# Patient Record
Sex: Male | Born: 1980 | Race: White | Hispanic: No | Marital: Married | Smoking: Current some day smoker
Health system: Southern US, Community
[De-identification: ages and names within clinical notes are randomized; demographics above are authoritative.]

---

## 2013-10-18 ENCOUNTER — Encounter (HOSPITAL_BASED_OUTPATIENT_CLINIC_OR_DEPARTMENT_OTHER): Payer: Self-pay | Admitting: Emergency Medicine

## 2013-10-18 ENCOUNTER — Emergency Department (HOSPITAL_BASED_OUTPATIENT_CLINIC_OR_DEPARTMENT_OTHER)
Admission: EM | Admit: 2013-10-18 | Discharge: 2013-10-18 | Disposition: A | Discharge status: Home / Self Care | Payer: Worker's Compensation | Attending: Emergency Medicine | Admitting: Emergency Medicine

## 2013-10-18 ENCOUNTER — Emergency Department (HOSPITAL_BASED_OUTPATIENT_CLINIC_OR_DEPARTMENT_OTHER): Payer: Worker's Compensation

## 2013-10-18 DIAGNOSIS — S93602A Unspecified sprain of left foot, initial encounter: Secondary | ICD-10-CM

## 2013-10-18 DIAGNOSIS — S93609A Unspecified sprain of unspecified foot, initial encounter: Secondary | ICD-10-CM | POA: Insufficient documentation

## 2013-10-18 DIAGNOSIS — Y929 Unspecified place or not applicable: Secondary | ICD-10-CM | POA: Insufficient documentation

## 2013-10-18 DIAGNOSIS — F17200 Nicotine dependence, unspecified, uncomplicated: Secondary | ICD-10-CM | POA: Insufficient documentation

## 2013-10-18 DIAGNOSIS — R296 Repeated falls: Secondary | ICD-10-CM | POA: Insufficient documentation

## 2013-10-18 DIAGNOSIS — Y9389 Activity, other specified: Secondary | ICD-10-CM | POA: Insufficient documentation

## 2013-10-18 NOTE — ED Notes (Signed)
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

## 2013-10-18 NOTE — ED Notes (Signed)
Pt states at work his lt foot was wedged in a fabric roll and started to fall but foot stayed. C/o lt ankle pain with swelling

## 2013-10-18 NOTE — ED Notes (Signed)
MD at bedside. 

## 2013-10-18 NOTE — ED Provider Notes (Signed)
CSN: [id number]     Arrival date & time 10/18/13  0142 History   First MD Initiated Contact with Patient 10/18/13 0249     Chief Complaint  Patient presents with  . Ankle Pain     (Consider location/radiation/quality/duration/timing/severity/associated sxs/prior Treatment) HPI Comments: Patient is a 33 year old male otherwise healthy who presents with complaints of left foot and ankle pain. States a roll of fabric rolled onto his ankle and caused him to fall awkwardly. He has swelling to the top of the foot just below the ankle is having difficulty bearing weight.  Patient is a 33 y.o. male presenting with ankle pain. The history is provided by the patient.  Ankle Pain Location:  Foot and ankle Time since incident:  2 hours Injury: yes   Ankle location:  L ankle Foot location:  L foot Chronicity:  New Prior injury to area:  No Relieved by:  Nothing Worsened by:  Activity and bearing weight   History reviewed. No pertinent past medical history. History reviewed. No pertinent past surgical history. No family history on file. History  Substance Use Topics  . Smoking status: Current Some Day Smoker  . Smokeless tobacco: Not on file  . Alcohol Use: Yes     Comment: social    Review of Systems  All other systems reviewed and are negative.     Allergies  Review of patient's allergies indicates no known allergies.  Home Medications   Prior to Admission medications   Not on File   BP 113/87  Pulse 102  Temp(Src) 98.6 F (37 C) (Oral)  Resp 20  Ht 5\' 11"  (1.803 m)  Wt 170 lb (77.111 kg)  BMI 23.72 kg/m2  SpO2 100% Physical Exam  Nursing note and vitals reviewed. Constitutional: He is oriented to person, place, and time. He appears well-developed and well-nourished. No distress.  HENT:  Head: Normocephalic and atraumatic.  Neck: Normal range of motion. Neck supple.  Musculoskeletal: Normal range of motion.  The left ankle appears grossly normal. There is no  tenderness to palpation over the medial or lateral malleolus. There is tenderness to palpation over the lateral aspect of the dorsum of the foot along with swelling.  Neurological: He is alert and oriented to person, place, and time.  Skin: Skin is warm and dry. He is not diaphoretic.    ED Course  Procedures (including critical care time) Labs Review Labs Reviewed - No data to display  Imaging Review Dg Ankle Complete Left  10/18/2013   CLINICAL DATA:  Left ankle injury this morning.  Pain and swelling.  EXAM: LEFT ANKLE COMPLETE - 3+ VIEW  COMPARISON:  None.  FINDINGS: No evidence of acute fracture or dislocation of the left ankle. Ankle mortise and talar dome appear intact. Soft tissues are unremarkable. Benign-appearing sclerosis in the distal tibia posteriorly probably represents an old fibrous cortical defect.  IMPRESSION: No acute bony abnormalities.   Electronically Signed   By: Burman NievesWilliam  Stevens M.D.   On: 10/18/2013 02:33   Dg Foot Complete Left  10/18/2013   CLINICAL DATA:  Left foot injury at work with pain and swelling.  EXAM: LEFT FOOT - COMPLETE 3+ VIEW  COMPARISON:  None.  FINDINGS: There is no evidence of fracture or dislocation. There is no evidence of arthropathy or other focal bone abnormality. Soft tissues are unremarkable.  IMPRESSION: Negative.   Electronically Signed   By: Burman NievesWilliam  Stevens M.D.   On: 10/18/2013 02:33     EKG Interpretation None  MDM   Final diagnoses:  None    X-rays are negative. This appears to be a sprain of the left foot. This will be treated with an Ace bandage, rest, ice, elevation, and ibuprofen.    Geoffery Lyonsouglas Jeramie Scogin, MD 10/18/13 [phone]

## 2013-10-18 NOTE — Discharge Instructions (Signed)
Rest. Ice for 20 minutes every 2 hours while awake for the next 2 days. Wear Ace bandage. Elevate.  Ibuprofen 600 mg every 6 hours as needed for pain.  Followup with your primary Dr. if not improving in the next week.   Foot Sprain The muscles and cord like structures which attach muscle to bone (tendons) that surround the feet are made up of units. A foot sprain can occur at the weakest spot in any of these units. This condition is most often caused by injury to or overuse of the foot, as from playing contact sports, or aggravating a previous injury, or from poor conditioning, or obesity. SYMPTOMS  Pain with movement of the foot.  Tenderness and swelling at the injury site.  Loss of strength is present in moderate or severe sprains. THE THREE GRADES OR SEVERITY OF FOOT SPRAIN ARE:  Mild (Grade I): Slightly pulled muscle without tearing of muscle or tendon fibers or loss of strength.  Moderate (Grade II): Tearing of fibers in a muscle, tendon, or at the attachment to bone, with small decrease in strength.  Severe (Grade III): Rupture of the muscle-tendon-bone attachment, with separation of fibers. Severe sprain requires surgical repair. Often repeating (chronic) sprains are caused by overuse. Sudden (acute) sprains are caused by direct injury or over-use. DIAGNOSIS  Diagnosis of this condition is usually by your own observation. If problems continue, a caregiver may be required for further evaluation and treatment. X-rays may be required to make sure there are not breaks in the bones (fractures) present. Continued problems may require physical therapy for treatment. PREVENTION  Use strength and conditioning exercises appropriate for your sport.  Warm up properly prior to working out.  Use athletic shoes that are made for the sport you are participating in.  Allow adequate time for healing. Early return to activities makes repeat injury more likely, and can lead to an unstable  arthritic foot that can result in prolonged disability. Mild sprains generally heal in 3 to 10 days, with moderate and severe sprains taking 2 to 10 weeks. Your caregiver can help you determine the proper time required for healing. HOME CARE INSTRUCTIONS   Apply ice to the injury for 15-20 minutes, 03-04 times per day. Put the ice in a plastic bag and place a towel between the bag of ice and your skin.  An elastic wrap (like an Ace bandage) may be used to keep swelling down.  Keep foot above the level of the heart, or at least raised on a footstool, when swelling and pain are present.  Try to avoid use other than gentle range of motion while the foot is painful. Do not resume use until instructed by your caregiver. Then begin use gradually, not increasing use to the point of pain. If pain does develop, decrease use and continue the above measures, gradually increasing activities that do not cause discomfort, until you gradually achieve normal use.  Use crutches if and as instructed, and for the length of time instructed.  Keep injured foot and ankle wrapped between treatments.  Massage foot and ankle for comfort and to keep swelling down. Massage from the toes up towards the knee.  Only take over-the-counter or prescription medicines for pain, discomfort, or fever as directed by your caregiver. SEEK IMMEDIATE MEDICAL CARE IF:   Your pain and swelling increase, or pain is not controlled with medications.  You have loss of feeling in your foot or your foot turns cold or blue.  You develop  new, unexplained symptoms, or an increase of the symptoms that brought you to your caregiver. MAKE SURE YOU:   Understand these instructions.  Will watch your condition.  Will get help right away if you are not doing well or get worse. Document Released: 10/01/2001 Document Revised: 07/04/2011 Document Reviewed: 11/29/2007 [facility] Patient Information 2015 [place], [place]. This information is not  intended to replace advice given to you by your health care provider. Make sure you discuss any questions you have with your health care provider.

## 2015-07-25 IMAGING — CR DG ANKLE COMPLETE 3+V*L*
3 series · 3 of 3 positions shown · non-contrast
Comparison: None.

CLINICAL DATA: Left ankle injury this morning.  Pain and swelling.

EXAM:
LEFT ANKLE COMPLETE - 3+ VIEW

[t ankle joint ap left]
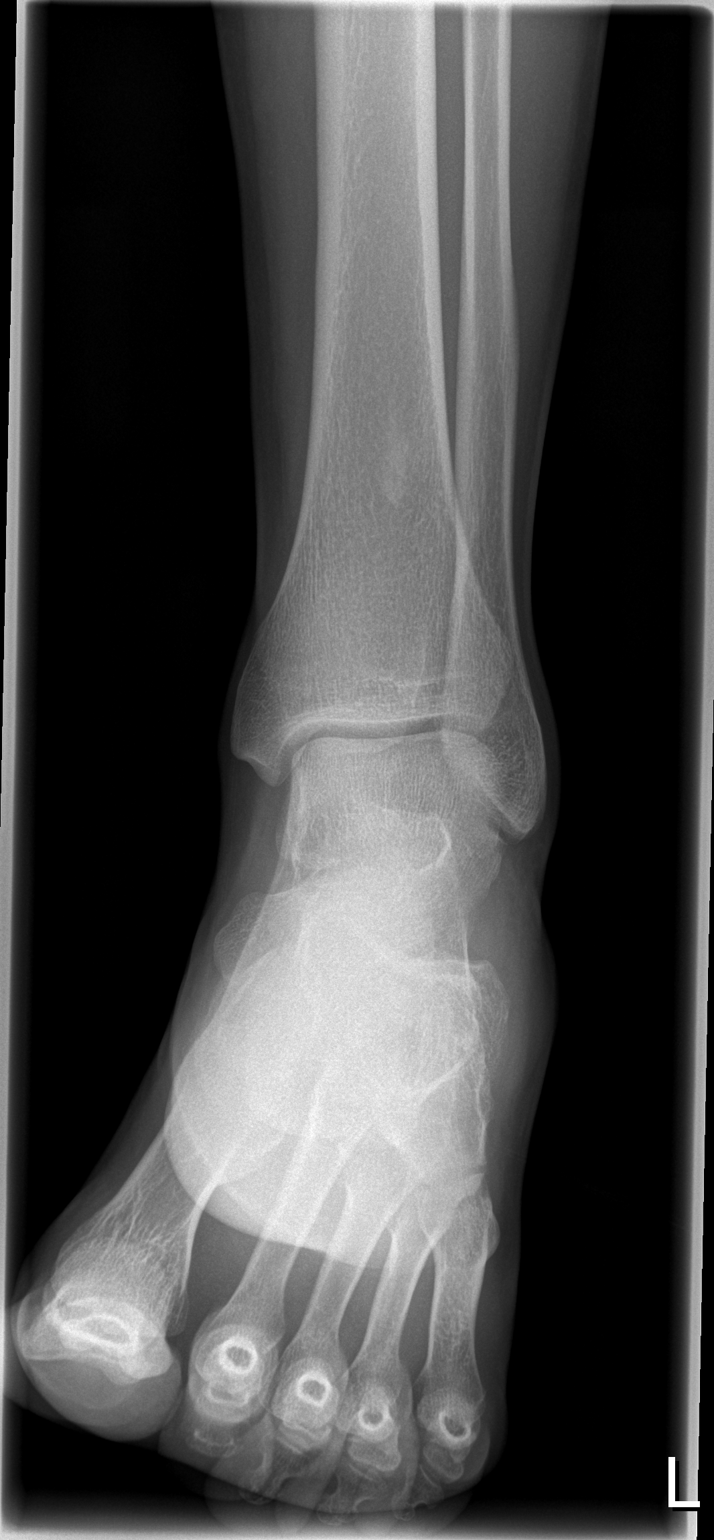

[t ankle joint oblique left]
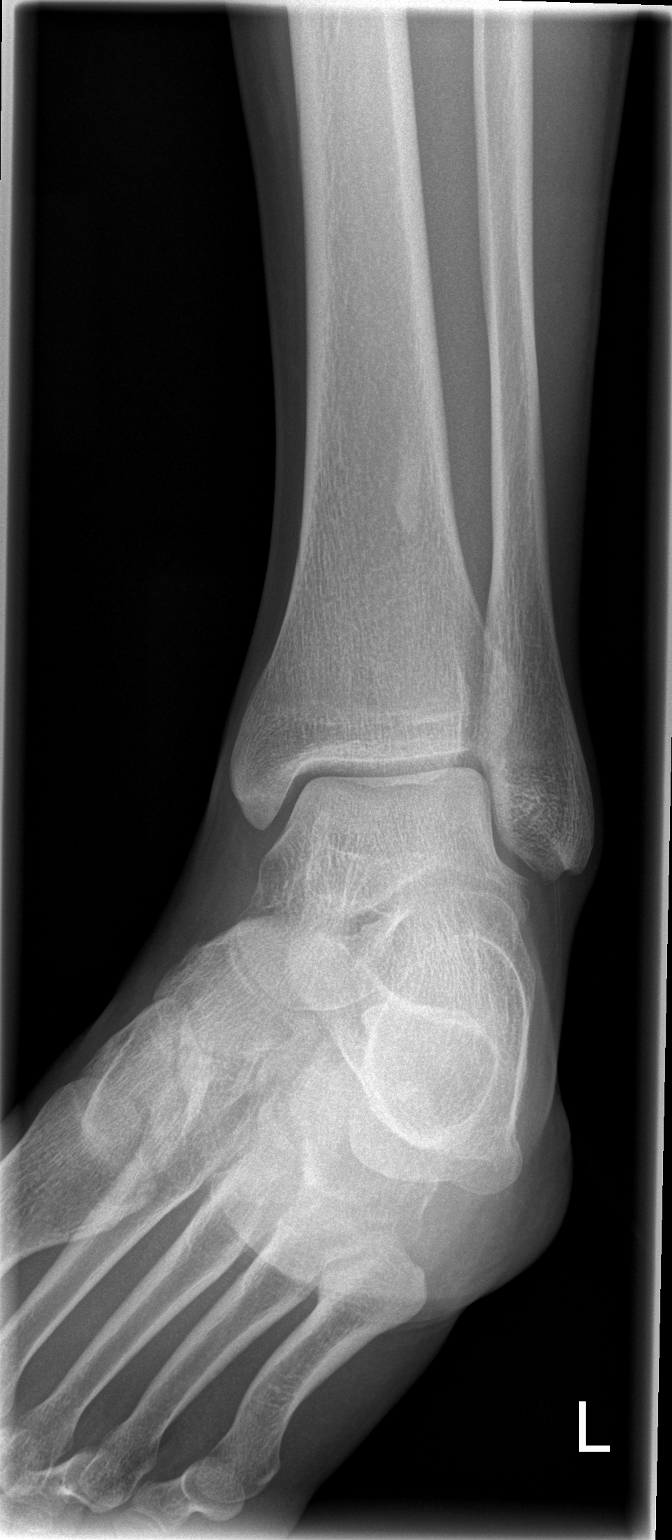

[t ankle joint lat left]
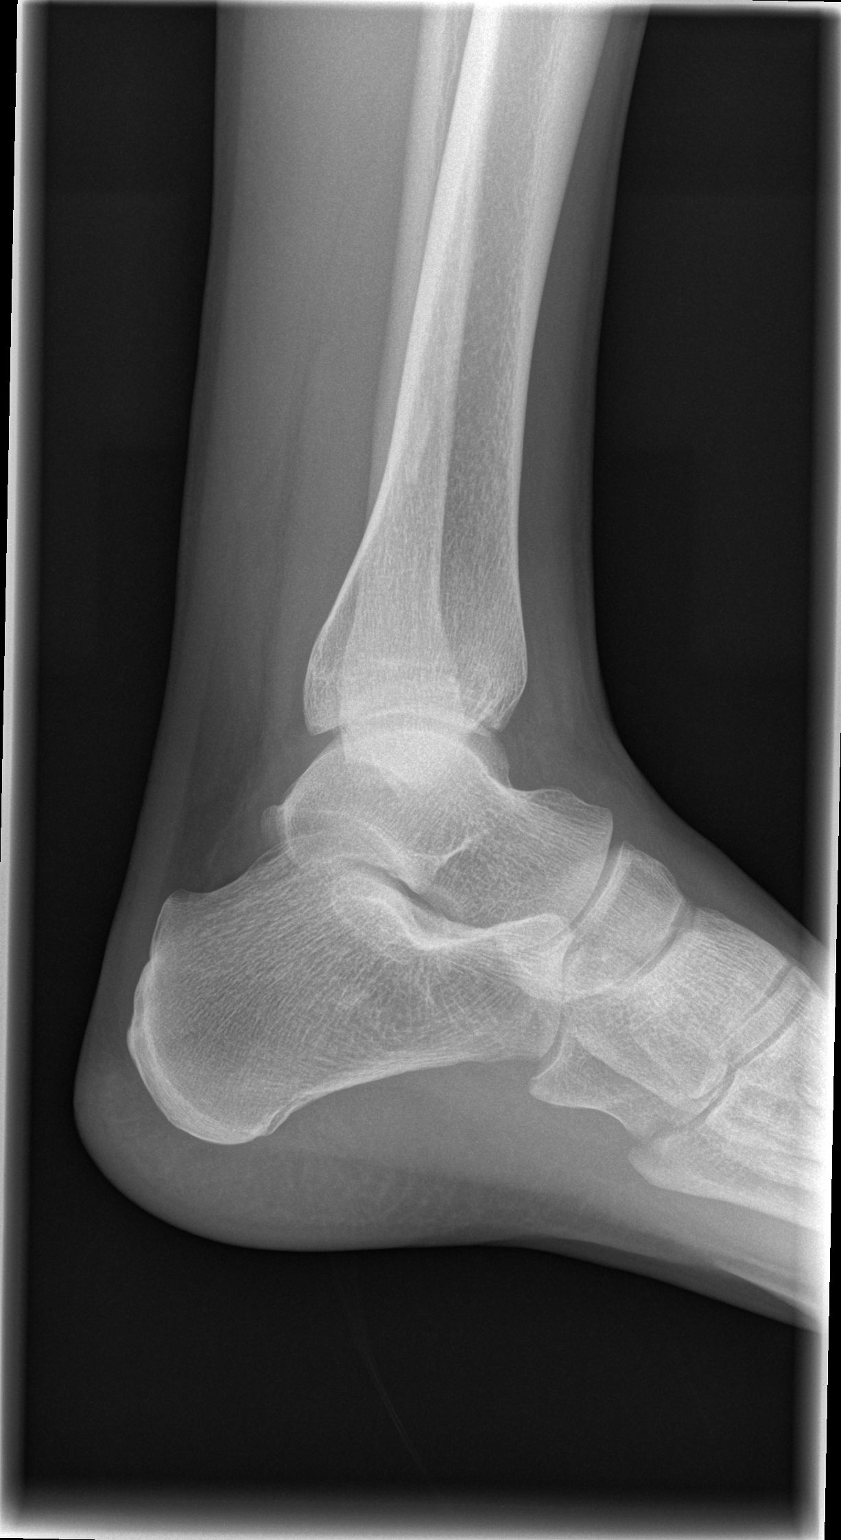

[3 of 3 positions shown; findings below may reference images not displayed]

FINDINGS: No evidence of acute fracture or dislocation of the left ankle.
Ankle mortise and talar dome appear intact. Soft tissues are
unremarkable. Benign-appearing sclerosis in the distal tibia
posteriorly probably represents an old fibrous cortical defect.
IMPRESSION: No acute bony abnormalities.

## 2016-02-05 ENCOUNTER — Emergency Department (HOSPITAL_BASED_OUTPATIENT_CLINIC_OR_DEPARTMENT_OTHER)
Admission: EM | Admit: 2016-02-05 | Discharge: 2016-02-05 | Disposition: A | Discharge status: Home / Self Care | Payer: Worker's Compensation | Attending: Emergency Medicine | Admitting: Emergency Medicine

## 2016-02-05 ENCOUNTER — Encounter (HOSPITAL_BASED_OUTPATIENT_CLINIC_OR_DEPARTMENT_OTHER): Payer: Self-pay | Admitting: *Deleted

## 2016-02-05 DIAGNOSIS — Y939 Activity, unspecified: Secondary | ICD-10-CM | POA: Diagnosis not present

## 2016-02-05 DIAGNOSIS — Z79899 Other long term (current) drug therapy: Secondary | ICD-10-CM | POA: Insufficient documentation

## 2016-02-05 DIAGNOSIS — W268XXA Contact with other sharp object(s), not elsewhere classified, initial encounter: Secondary | ICD-10-CM | POA: Diagnosis not present

## 2016-02-05 DIAGNOSIS — S0181XA Laceration without foreign body of other part of head, initial encounter: Secondary | ICD-10-CM | POA: Insufficient documentation

## 2016-02-05 DIAGNOSIS — F17200 Nicotine dependence, unspecified, uncomplicated: Secondary | ICD-10-CM | POA: Diagnosis not present

## 2016-02-05 DIAGNOSIS — Y990 Civilian activity done for income or pay: Secondary | ICD-10-CM | POA: Diagnosis not present

## 2016-02-05 DIAGNOSIS — Y929 Unspecified place or not applicable: Secondary | ICD-10-CM | POA: Diagnosis not present

## 2016-02-05 MED ORDER — TETANUS-DIPHTH-ACELL PERTUSSIS 5-2.5-18.5 LF-MCG/0.5 IM SUSP: 0.5000 mL | Freq: Once | INTRAMUSCULAR | Status: DC

## 2016-02-05 NOTE — ED Notes (Signed)
Patient is alert and oriented x3.  He was given DC instructions and follow up visit instructions.  Patient gave verbal understanding.  He was DC ambulatory under his own power to home.  V/S stable.  He was not showing any signs of distress on DC 

## 2016-02-05 NOTE — ED Triage Notes (Signed)
Patient is alert and oriented x4.  He is being seen for a facial laceration to above the left lip.  Bleeding is controlled on arrival to the ED.  Patient rates his pain 3 of 10.

## 2016-02-05 NOTE — ED Provider Notes (Signed)
[facility] Provider Note: Tyler Dell, MD, FACEP  CSN: [id number] MRN: [id number] ARRIVAL: 02/05/16 at 0025   CHIEF COMPLAINT  Facial Laceration   HISTORY OF PRESENT ILLNESS  Tyler Price is a 35 y.o. male who had a metal object struck him in the face at work a couple of hours ago. There was mild to moderate bleeding which has been controlled with pressure. There is minimal associated pain. He denies other injury.   History reviewed. No pertinent past medical history.  History reviewed. No pertinent surgical history.  No family history on file.  Social History  Substance Use Topics  . Smoking status: Current Some Day Smoker  . Smokeless tobacco: Not on file  . Alcohol use Yes     Comment: social    Prior to Admission medications   Medication Sig Start Date End Date Taking? Authorizing Provider  amphetamine-dextroamphetamine (ADDERALL) 15 MG tablet Take 15 mg by mouth 3 (three) times daily.   Yes Historical Provider, MD  escitalopram (LEXAPRO) 10 MG tablet Take 10 mg by mouth daily.   Yes Historical Provider, MD  zolpidem (AMBIEN) 10 MG tablet Take 10 mg by mouth at bedtime as needed for sleep.   Yes Historical Provider, MD    Allergies Review of patient's allergies indicates no known allergies.   REVIEW OF SYSTEMS  Negative except as noted here or in the History of Present Illness.   PHYSICAL EXAMINATION  Initial Vital Signs Blood pressure (!) 149/107, pulse 91, temperature 98.2 F (36.8 C), temperature source Oral, resp. rate 18, height 5\' 11"  (1.803 m), weight 185 lb (83.9 kg), SpO2 99 %.  Examination General: Well-developed, well-nourished male in no acute distress; appearance consistent with age of record HENT: normocephalic; Laceration of left upper lip just above the mustache hairline Eyes: pupils equal, round and reactive to light; extraocular muscles intact Neck: supple Heart: regular rate and rhythm Lungs: clear to auscultation  bilaterally Abdomen: soft; nondistended; nontender; bowel sounds present Extremities: No deformity; full range of motion Neurologic: Awake, alert and oriented; motor function intact in all extremities and symmetric; no facial droop Skin: Warm and dry Psychiatric: Normal mood and affect   RESULTS  Summary of this visit's results, reviewed by myself:   EKG Interpretation  Date/Time:    Ventricular Rate:    PR Interval:    QRS Duration:   QT Interval:    QTC Calculation:   R Axis:     Text Interpretation:        Laboratory Studies: No results found for this or any previous visit (from the past 24 hour(s)). Imaging Studies: No results found.  ED COURSE  Nursing notes and initial vitals signs, including pulse oximetry, reviewed.  Vitals:   02/05/16 0041 02/05/16 0043  BP:  (!) 149/107  Pulse:  91  Resp:  18  Temp:  98.2 F (36.8 C)  TempSrc:  Oral  SpO2:  99%  Weight: 185 lb (83.9 kg)   Height: 5\' 11"  (1.803 m)     PROCEDURES   LACERATION REPAIR Performed by: Hanley Seamen Authorized by: Hanley Seamen Consent: Verbal consent obtained. Risks and benefits: risks, benefits and alternatives were discussed Consent given by: patient Patient identity confirmed: provided demographic data Prepped and Draped in normal sterile fashion Wound explored  Laceration Location: Face  Laceration Length: 1.2 cm  No Foreign Bodies seen or palpated  Anesthesia: None   Irrigation method: syringe Amount of cleaning: standard  Skin closure: Dermabond   Patient  tolerance: Patient tolerated the procedure well with no immediate complications.  ED DIAGNOSES     ICD-9-CM ICD-10-CM   1. Facial laceration, initial encounter 873.40 S01.81XA        Paula LibraJohn Lamaria Hildebrandt, MD 02/05/16 [phone]
